# Patient Record
Sex: Male | Born: 1985 | Race: White | Hispanic: Yes | Marital: Single | Smoking: Light tobacco smoker
Health system: Southern US, Community
[De-identification: ages and names within clinical notes are randomized; demographics above are authoritative.]

## PROBLEM LIST (undated history)

## (undated) DIAGNOSIS — N189 Chronic kidney disease, unspecified: Secondary | ICD-10-CM

## (undated) HISTORY — PX: OTHER SURGICAL HISTORY: SHX169

## (undated) HISTORY — PX: AMPUTATION OF REPLICATED TOES: SHX1136

## (undated) HISTORY — DX: Chronic kidney disease, unspecified: N18.9

---

## 2015-11-09 HISTORY — PX: HERNIA REPAIR: SHX51

## 2016-06-05 ENCOUNTER — Ambulatory Visit (INDEPENDENT_AMBULATORY_CARE_PROVIDER_SITE_OTHER): Payer: Managed Care, Other (non HMO) | Admitting: Emergency Medicine

## 2016-06-05 VITALS — BP 119/78 | HR 87 | Temp 97.6°F | Ht 74.0 in | Wt 167.0 lb

## 2016-06-05 DIAGNOSIS — Z202 Contact with and (suspected) exposure to infections with a predominantly sexual mode of transmission: Secondary | ICD-10-CM | POA: Diagnosis not present

## 2016-06-05 NOTE — Patient Instructions (Addendum)
IF you received an x-ray today, you will receive an invoice from [facility]. Please contact [facility] at [phone] with questions or concerns regarding your invoice.   IF you received labwork today, you will receive an invoice from [facility]. Please contact [facility] at [phone] with questions or concerns regarding your invoice.   Our billing staff will not be able to assist you with questions regarding bills from these companies.  You will be contacted with the lab results as soon as they are available. The fastest way to get your results is to activate your My Chart account. Instructions are located on the last page of this paperwork. If you have not heard from us regarding the results in 2 weeks, please contact this office.      Sexually Transmitted Disease A sexually transmitted disease (STD) is a disease or infection often passed to another person during sex. However, STDs can be passed through nonsexual ways. An STD can be passed through:  Spit (saliva).  Semen.  Blood.  Mucus from the vagina.  Pee (urine). How can I lessen my chances of getting an STD?  Use:  Latex condoms.  Water-soluble lubricants with condoms. Do not use petroleum jelly or oils.  Dental dams. These are small pieces of latex that are used as a barrier during oral sex.  Avoid having more than one sex partner.  Do not have sex with someone who has other sex partners.  Do not have sex with anyone you do not know or who is at high risk for an STD.  Avoid risky sex that can break your skin.  Do not have sex if you have open sores on your mouth or skin.  Avoid drinking too much alcohol or taking illegal drugs. Alcohol and drugs can affect your good judgment.  Avoid oral and anal sex acts.  Get shots (vaccines) for HPV and hepatitis.  If you are at risk of being infected with HIV, it is advised that you take a certain medicine daily to prevent HIV infection. This is  called pre-exposure prophylaxis (PrEP). You may be at risk if:  You are a man who has sex with other men (MSM).  You are attracted to the opposite sex (heterosexual) and are having sex with more than one partner.  You take drugs with a needle.  You have sex with someone who has HIV.  Talk with your doctor about if you are at high risk of being infected with HIV. If you begin to take PrEP, get tested for HIV first. Get tested every 3 months for as long as you are taking PrEP.  Get tested for STDs every year if you are sexually active. If you are treated for an STD, get tested again 3 months after you are treated. What should I do if I think I have an STD?  See your doctor.  Tell your sex partner(s) that you have an STD. They should be tested and treated.  Do not have sex until your doctor says it is okay. When should I get help? Get help right away if:  You have bad belly (abdominal) pain.  You are a man and have puffiness (swelling) or pain in your testicles.  You are a woman and have puffiness in your vagina. This information is not intended to replace advice given to you by your health care provider. Make sure you discuss any questions you have with your health care provider. Document Released: 05/04/2004 Document Revised: 09/02/2015  Reviewed: 09/20/2012 Elsevier Interactive Patient Education  2017 Elsevier Inc.  

## 2016-06-05 NOTE — Progress Notes (Signed)
Kenneth Berg 31 y.o.   Chief Complaint  Patient presents with  . STD Test    HISTORY OF PRESENT ILLNESS: This is a 31 y.o. male with STD concern but no symptoms; had unprotected sexual intercourse last Saturday.  HPI   Prior to Admission medications   Not on File    Allergies  Allergen Reactions  . Prednisone     There are no active problems to display for this patient.   Past Medical History:  Diagnosis Date  . Chronic kidney disease     Past Surgical History:  Procedure Laterality Date  . HERNIA REPAIR  11/2015    Social History   Social History  . Marital status: Single    Spouse name: N/A  . Number of children: N/A  . Years of education: N/A   Occupational History  . Not on file.   Social History Main Topics  . Smoking status: Light Tobacco Smoker    Packs/day: 0.25  . Smokeless tobacco: Never Used  . Alcohol use 6.0 oz/week    10 Shots of liquor per week  . Drug use: No  . Sexual activity: Not on file   Other Topics Concern  . Not on file   Social History Narrative  . No narrative on file    Family History  Problem Relation Age of Onset  . Hypertension Maternal Grandmother   . Diabetes Paternal Grandmother      Review of Systems  Constitutional: Negative.  Negative for chills and fever.  HENT: Negative for sore throat.   Eyes: Negative for discharge and redness.  Respiratory: Negative.  Negative for cough and shortness of breath.   Cardiovascular: Negative for chest pain.  Gastrointestinal: Negative.  Negative for abdominal pain, nausea and vomiting.  Genitourinary: Negative.  Negative for dysuria, flank pain, frequency, hematuria and urgency.  Musculoskeletal: Negative.  Negative for joint pain and myalgias.  Skin: Negative.  Negative for rash.  Neurological: Negative.  Negative for dizziness and headaches.   Vitals:   06/05/16 1730  BP: 119/78  Pulse: 87  Temp: 97.6 F (36.4 C)     Physical Exam  Constitutional:  He is oriented to person, place, and time. He appears well-developed and well-nourished.  HENT:  Head: Normocephalic and atraumatic.  Nose: Nose normal.  Mouth/Throat: Oropharynx is clear and moist.  Eyes: Conjunctivae and EOM are normal. Pupils are equal, round, and reactive to light.  Neck: Normal range of motion. Neck supple.  Cardiovascular: Normal rate and regular rhythm.   Pulmonary/Chest: Effort normal and breath sounds normal.  Abdominal: Soft. Bowel sounds are normal.  Musculoskeletal: Normal range of motion.  Neurological: He is alert and oriented to person, place, and time.  Skin: Skin is warm and dry. Capillary refill takes less than 2 seconds.  Psychiatric: He has a normal mood and affect. His behavior is normal.  Vitals reviewed.    ASSESSMENT & PLAN: Tadhg was seen today for std test.  Diagnoses and all orders for this visit:  Possible exposure to STD -     Chlamydia trachomatis, DNA, amp probe -     Gonococcus DNA, PCR -     Urine culture -     HIV antibody -     Testosterone    Patient Instructions       IF you received an x-ray today, you will receive an invoice from [facility]. Please contact [facility] at [phone] with questions or concerns regarding your invoice.   IF  you received labwork today, you will receive an invoice from [facility]. Please contact [facility] at [phone] with questions or concerns regarding your invoice.   Our billing staff will not be able to assist you with questions regarding bills from these companies.  You will be contacted with the lab results as soon as they are available. The fastest way to get your results is to activate your My Chart account. Instructions are located on the last page of this paperwork. If you have not heard from us regarding the results in 2 weeks, please contact this office.      Sexually Transmitted Disease A sexually transmitted disease (STD) is a disease or infection  often passed to another person during sex. However, STDs can be passed through nonsexual ways. An STD can be passed through:  Spit (saliva).  Semen.  Blood.  Mucus from the vagina.  Pee (urine). How can I lessen my chances of getting an STD?  Use:  Latex condoms.  Water-soluble lubricants with condoms. Do not use petroleum jelly or oils.  Dental dams. These are small pieces of latex that are used as a barrier during oral sex.  Avoid having more than one sex partner.  Do not have sex with someone who has other sex partners.  Do not have sex with anyone you do not know or who is at high risk for an STD.  Avoid risky sex that can break your skin.  Do not have sex if you have open sores on your mouth or skin.  Avoid drinking too much alcohol or taking illegal drugs. Alcohol and drugs can affect your good judgment.  Avoid oral and anal sex acts.  Get shots (vaccines) for HPV and hepatitis.  If you are at risk of being infected with HIV, it is advised that you take a certain medicine daily to prevent HIV infection. This is called pre-exposure prophylaxis (PrEP). You may be at risk if:  You are a man who has sex with other men (MSM).  You are attracted to the opposite sex (heterosexual) and are having sex with more than one partner.  You take drugs with a needle.  You have sex with someone who has HIV.  Talk with your doctor about if you are at high risk of being infected with HIV. If you begin to take PrEP, get tested for HIV first. Get tested every 3 months for as long as you are taking PrEP.  Get tested for STDs every year if you are sexually active. If you are treated for an STD, get tested again 3 months after you are treated. What should I do if I think I have an STD?  See your doctor.  Tell your sex partner(s) that you have an STD. They should be tested and treated.  Do not have sex until your doctor says it is okay. When should I get help? Get help right  away if:  You have bad belly (abdominal) pain.  You are a man and have puffiness (swelling) or pain in your testicles.  You are a woman and have puffiness in your vagina. This information is not intended to replace advice given to you by your health care provider. Make sure you discuss any questions you have with your health care provider. Document Released: 05/04/2004 Document Revised: 09/02/2015 Document Reviewed: 09/20/2012 Elsevier Interactive Patient Education  2017 Elsevier Inc.    Edwina BarthMiguel Jovi Alvizo, MD [facility]

## 2016-06-06 LAB — TESTOSTERONE: Testosterone: 310 ng/dL (ref 264–916)

## 2016-06-06 LAB — HIV ANTIBODY (ROUTINE TESTING W REFLEX): HIV Screen 4th Generation wRfx: NONREACTIVE

## 2016-06-07 LAB — CHLAMYDIA TRACHOMATIS, DNA, AMP PROBE: CHLAMYDIA, DNA PROBE: NEGATIVE

## 2016-06-07 LAB — URINE CULTURE: ORGANISM ID, BACTERIA: NO GROWTH

## 2016-06-09 ENCOUNTER — Telehealth: Payer: Self-pay | Admitting: Emergency Medicine

## 2016-06-09 LAB — GONOCOCCUS DNA, PCR: NEISSERIA GONORRHOEAE BY PCR: NEGATIVE

## 2016-06-09 NOTE — Telephone Encounter (Signed)
Pt would like a callback concerning 2 unreleased lab results. He has already received 3 results but would like information on the other 2. Please advise. Best callback number [phone].

## 2016-06-09 NOTE — Telephone Encounter (Signed)
See note from patient

## 2016-06-10 NOTE — Telephone Encounter (Signed)
Pt called concerning the same matter pt would like his last 2 lab test results read to him.

## 2016-08-08 ENCOUNTER — Encounter (HOSPITAL_COMMUNITY): Payer: Self-pay | Admitting: Emergency Medicine

## 2016-08-08 ENCOUNTER — Emergency Department (HOSPITAL_COMMUNITY)
Admission: EM | Admit: 2016-08-08 | Discharge: 2016-08-08 | Disposition: A | Discharge status: Home / Self Care | Payer: Worker's Compensation | Attending: Physician Assistant | Admitting: Physician Assistant

## 2016-08-08 ENCOUNTER — Emergency Department (HOSPITAL_COMMUNITY): Payer: Worker's Compensation

## 2016-08-08 DIAGNOSIS — Y939 Activity, unspecified: Secondary | ICD-10-CM | POA: Insufficient documentation

## 2016-08-08 DIAGNOSIS — Y990 Civilian activity done for income or pay: Secondary | ICD-10-CM | POA: Diagnosis not present

## 2016-08-08 DIAGNOSIS — S0033XA Contusion of nose, initial encounter: Secondary | ICD-10-CM | POA: Insufficient documentation

## 2016-08-08 DIAGNOSIS — F17200 Nicotine dependence, unspecified, uncomplicated: Secondary | ICD-10-CM | POA: Diagnosis not present

## 2016-08-08 DIAGNOSIS — N189 Chronic kidney disease, unspecified: Secondary | ICD-10-CM | POA: Diagnosis not present

## 2016-08-08 DIAGNOSIS — S0083XA Contusion of other part of head, initial encounter: Secondary | ICD-10-CM | POA: Insufficient documentation

## 2016-08-08 DIAGNOSIS — S0993XA Unspecified injury of face, initial encounter: Secondary | ICD-10-CM | POA: Diagnosis present

## 2016-08-08 DIAGNOSIS — W228XXA Striking against or struck by other objects, initial encounter: Secondary | ICD-10-CM | POA: Diagnosis not present

## 2016-08-08 DIAGNOSIS — Y929 Unspecified place or not applicable: Secondary | ICD-10-CM | POA: Diagnosis not present
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
## 2016-08-08 NOTE — ED Notes (Signed)
Patient transported to CT 

## 2016-08-08 NOTE — ED Notes (Signed)
Pt returned from CT °

## 2020-07-27 ENCOUNTER — Encounter: Payer: Self-pay | Admitting: Physician Assistant

## 2020-08-05 DIAGNOSIS — K589 Irritable bowel syndrome without diarrhea: Secondary | ICD-10-CM

## 2020-08-05 DIAGNOSIS — K824 Cholesterolosis of gallbladder: Secondary | ICD-10-CM | POA: Insufficient documentation

## 2020-08-05 DIAGNOSIS — R1012 Left upper quadrant pain: Secondary | ICD-10-CM

## 2020-08-05 DIAGNOSIS — H6980 Other specified disorders of Eustachian tube, unspecified ear: Secondary | ICD-10-CM | POA: Insufficient documentation

## 2020-08-05 DIAGNOSIS — H6990 Unspecified Eustachian tube disorder, unspecified ear: Secondary | ICD-10-CM | POA: Insufficient documentation

## 2020-08-05 DIAGNOSIS — R195 Other fecal abnormalities: Secondary | ICD-10-CM | POA: Insufficient documentation

## 2020-08-05 DIAGNOSIS — R140 Abdominal distension (gaseous): Secondary | ICD-10-CM | POA: Insufficient documentation

## 2020-08-05 DIAGNOSIS — E7800 Pure hypercholesterolemia, unspecified: Secondary | ICD-10-CM | POA: Insufficient documentation

## 2020-08-05 DIAGNOSIS — K219 Gastro-esophageal reflux disease without esophagitis: Secondary | ICD-10-CM | POA: Insufficient documentation

## 2020-08-05 DIAGNOSIS — R1013 Epigastric pain: Secondary | ICD-10-CM

## 2020-08-11 ENCOUNTER — Ambulatory Visit: Payer: Managed Care, Other (non HMO) | Admitting: Physician Assistant

## 2021-04-09 ENCOUNTER — Encounter (HOSPITAL_COMMUNITY): Payer: Self-pay

## 2021-04-09 ENCOUNTER — Emergency Department (HOSPITAL_COMMUNITY): Payer: Managed Care, Other (non HMO)

## 2021-04-09 ENCOUNTER — Emergency Department (HOSPITAL_COMMUNITY)
Admission: EM | Admit: 2021-04-09 | Discharge: 2021-04-09 | Disposition: A | Discharge status: Home / Self Care | Payer: Managed Care, Other (non HMO) | Attending: Emergency Medicine | Admitting: Emergency Medicine

## 2021-04-09 DIAGNOSIS — S8991XA Unspecified injury of right lower leg, initial encounter: Secondary | ICD-10-CM | POA: Diagnosis present

## 2021-04-09 DIAGNOSIS — S81801A Unspecified open wound, right lower leg, initial encounter: Secondary | ICD-10-CM | POA: Insufficient documentation

## 2021-04-09 DIAGNOSIS — N189 Chronic kidney disease, unspecified: Secondary | ICD-10-CM | POA: Diagnosis not present

## 2021-04-09 DIAGNOSIS — Y249XXA Unspecified firearm discharge, undetermined intent, initial encounter: Secondary | ICD-10-CM

## 2021-04-09 DIAGNOSIS — F17210 Nicotine dependence, cigarettes, uncomplicated: Secondary | ICD-10-CM | POA: Insufficient documentation

## 2021-04-09 DIAGNOSIS — W3400XA Accidental discharge from unspecified firearms or gun, initial encounter: Secondary | ICD-10-CM | POA: Diagnosis not present

## 2021-04-09 DIAGNOSIS — Z23 Encounter for immunization: Secondary | ICD-10-CM | POA: Insufficient documentation

## 2021-04-09 MED ORDER — TETANUS-DIPHTH-ACELL PERTUSSIS 5-2.5-18.5 LF-MCG/0.5 IM SUSY
0.5000 mL | PREFILLED_SYRINGE | Freq: Once | INTRAMUSCULAR | Status: AC
  Administered 2021-04-09: 0.5 mL via INTRAMUSCULAR
  Filled 2021-04-09: qty 0.5
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
## 2021-04-09 NOTE — Discharge Instructions (Signed)
You were evaluated in the Emergency Department and after careful evaluation, we did not find any emergent condition requiring admission or further testing in the hospital.  Your exam/testing today is overall reassuring.  X-ray was overall reassuring.  We discussed the very subtle irregularity on your x-ray and we feel that it is very unlikely that you have a broken bone.  The wound should heal well with time, can change the dressing daily and keep an eye out for any redness or discharge or increased pain.  Recommend Tylenol or Motrin for discomfort.  Use the crutches as needed.  Please return to the Emergency Department if you experience any worsening of your condition.   Thank you for allowing [place] to be a part of your care.

## 2021-07-21 ENCOUNTER — Telehealth: Payer: Self-pay | Admitting: Family Medicine

## 2021-07-21 NOTE — Telephone Encounter (Signed)
Called Pt to reschedule new patient appt w/ Dr. Carmelia Roller due to provider schedule. LVM ?

## 2021-08-09 ENCOUNTER — Ambulatory Visit (INDEPENDENT_AMBULATORY_CARE_PROVIDER_SITE_OTHER): Payer: 59 | Admitting: Family Medicine

## 2021-08-09 ENCOUNTER — Encounter: Payer: Self-pay | Admitting: Family Medicine

## 2021-08-09 VITALS — BP 120/76 | HR 86 | Temp 98.1°F | Ht 74.0 in | Wt 197.0 lb

## 2021-08-09 DIAGNOSIS — G8929 Other chronic pain: Secondary | ICD-10-CM | POA: Diagnosis not present

## 2021-08-09 DIAGNOSIS — Z0000 Encounter for general adult medical examination without abnormal findings: Secondary | ICD-10-CM | POA: Diagnosis not present

## 2021-08-09 DIAGNOSIS — M25562 Pain in left knee: Secondary | ICD-10-CM | POA: Diagnosis not present

## 2021-08-09 LAB — COMPREHENSIVE METABOLIC PANEL
ALT: 27 U/L (ref 0–53)
AST: 19 U/L (ref 0–37)
Albumin: 4.8 g/dL (ref 3.5–5.2)
Alkaline Phosphatase: 57 U/L (ref 39–117)
BUN: 14 mg/dL (ref 6–23)
CO2: 32 mEq/L (ref 19–32)
Calcium: 9.2 mg/dL (ref 8.4–10.5)
Chloride: 100 mEq/L (ref 96–112)
Creatinine, Ser: 1.24 mg/dL (ref 0.40–1.50)
GFR: 75.93 mL/min (ref >60.00)
Glucose, Bld: 78 mg/dL (ref 70–99)
Potassium: 4.2 mEq/L (ref 3.5–5.1)
Sodium: 138 mEq/L (ref 135–145)
Total Bilirubin: 0.9 mg/dL (ref 0.2–1.2)
Total Protein: 7.2 g/dL (ref 6.0–8.3)

## 2021-08-09 LAB — CBC
HCT: 47 % (ref 39.0–52.0)
Hemoglobin: 15.7 g/dL (ref 13.0–17.0)
MCHC: 33.5 g/dL (ref 30.0–36.0)
MCV: 85.4 fl (ref 78.0–100.0)
Platelets: 260 10*3/uL (ref 150.0–400.0)
RBC: 5.5 Mil/uL (ref 4.22–5.81)
RDW: 13.7 % (ref 11.5–15.5)
WBC: 7.2 10*3/uL (ref 4.0–10.5)

## 2021-08-09 LAB — LIPID PANEL
Cholesterol: 175 mg/dL (ref 0–200)
HDL: 37 mg/dL — ABNORMAL LOW (ref >39.00)
LDL Cholesterol: 109 mg/dL — ABNORMAL HIGH (ref 0–99)
NonHDL: 137.96
Total CHOL/HDL Ratio: 5
Triglycerides: 147 mg/dL (ref 0.0–149.0)
VLDL: 29.4 mg/dL (ref 0.0–40.0)
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
## 2021-08-09 NOTE — Patient Instructions (Addendum)
Keep the diet clean and stay active. ? ?Give [place] 2-3 business days to get the results of your labs back.  ? ?Do monthly self testicular checks in the shower. You are feeling for lumps/bumps that don't belong. If you feel anything like this, let me know! ? ?Please get me a copy of your advanced directive form at your convenience.  ? ?Send me a message in 1 month if no better with the calf.  ? ?Let us know if you need anything. ? ?Stretching and range of motion exercises ?These exercises warm up your muscles and joints and improve the movement and flexibility of your lower leg. These exercises also help to relieve pain and stiffness. ? ?Exercise A: Gastrocnemius stretch ?Sit with your left / right leg extended. ?Loop a belt or towel around the ball of your left / right foot. The ball of your foot is on the walking surface, right under your toes. ?Hold both ends of the belt or towel. ?Keep your left / right ankle and foot relaxed and keep your knee straight while you use the belt or towel to pull your foot and ankle toward you. Stop at the first point of resistance. ?Hold this position for 30 seconds. ?Repeat 2 times. Complete this exercise 3 times per week. ? ?Exercise B: Ankle alphabet ?Sit with your left / right leg supported at the lower leg. ?Do not rest your foot on anything. ?Make sure your foot has room to move freely. ?Think of your left / right foot as a paintbrush, and move your foot to trace each letter of the alphabet in the air. Keep your hip and knee still while you trace. ?Trace every letter from A to Z. ?Repeat 2 times. Complete this exercise 3 times per week. ? ?Strengthening exercises ?These exercises build strength and endurance in your lower leg. Endurance is the ability to use your muscles for a long time, even after they get tired. ? ?Exercise C: Plantar flexors with band ?Sit with your left / right leg extended. ?Loop a rubber exercise band or tube around the ball of your left / right foot. The  ball of your foot is on the walking surface, right under your toes. ?While holding both ends of the band or tube, slowly point your toes downward, pushing them away from you. ?Hold this position for 3 seconds. ?Slowly return your foot to the starting position and repeat for a total of 10 repetitions. ?Repeat 2 times. Complete this exercise 3 times per week. ? ?Exercise D: Plantar flexors, standing ?Stand with your feet shoulder-width apart. ?Place your hands on a wall or table to steady yourself as needed, but try not to use it very much for support. ?Rise up on your toes. ?If this exercise is too easy, try these options: ?Shift your weight toward your left / right leg until you feel challenged. ?If told by your health care provider, stand on your left / right foot only. ?Hold this position for 3 seconds. ?Repeat for a total of 10 repetitions. ?Repeat 2 times. Complete this exercise 3 times per week. ? ?Exercise E: Plantar flexors, eccentric ?Stand on the balls of your feet on the edge of a step. The ball of your foot is on the walking surface, right under your toes. ?Place your hands on a wall or railing for balance as needed, but try not to lean on it for support. ?Rise up on your toes, using both legs to help. ?Slowly shift all of your  weight to your left / right foot and lift your other foot off the step. ?Slowly lower your left / right heel so it drops below the level of the step. You will feel a slight stretch in your left / right calf. ?Put your other foot back onto the step. ?Repeat 2 times. Complete this exercise 3 times per week. ?This information is not intended to replace advice given to you by your health care provider. Make sure you discuss any questions you have with your health care provider. ? ?

## 2021-10-24 ENCOUNTER — Ambulatory Visit (HOSPITAL_BASED_OUTPATIENT_CLINIC_OR_DEPARTMENT_OTHER)
Admission: RE | Admit: 2021-10-24 | Discharge: 2021-10-24 | Disposition: A | Discharge status: Home / Self Care | Payer: 59 | Source: Ambulatory Visit | Attending: Family Medicine | Admitting: Family Medicine

## 2021-10-24 ENCOUNTER — Ambulatory Visit: Payer: 59 | Admitting: Family Medicine

## 2021-10-24 ENCOUNTER — Encounter: Payer: Self-pay | Admitting: Family Medicine

## 2021-10-24 VITALS — BP 118/75 | HR 104 | Ht 74.0 in | Wt 198.4 lb

## 2021-10-24 DIAGNOSIS — M79644 Pain in right finger(s): Secondary | ICD-10-CM | POA: Diagnosis present

## 2021-10-24 NOTE — Patient Instructions (Addendum)
-   Xray today  - ice, heat, rest, compression, consider a thumb stabilizer brace over-the-counter to pain-producing activities at work - home exercises provided - since you cannot tolerate NSAIDs, try tylenol, topical pain relief creams over-the-counter - referral to sports medicine in case additional imaging is necessary

## 2021-10-24 NOTE — Progress Notes (Signed)
   Acute Office Visit  Subjective:     Patient ID: Kenneth Berg, male    DOB: 05-Nov-1985, 36 y.o.   MRN: [id number]  CC: right thumb pain   HPI Patient is in today for right thumb pain.  Patient reports he has had over a week of moderate pain to base of right thumb, medially. 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 States pain is with stretching, pressing, gripping. He has not noticed and swelling, bruising, numbness, tingling. He cannot tolerate antiinflammatories due to kidney concerns, but he has been getting some relief with ice.       ROS All review of systems negative except what is listed in the HPI      Objective:    BP 118/75   Pulse (!) 104   Ht 6\' 2"  (1.88 m)   Wt 198 lb 6.4 oz (90 kg)   BMI 25.47 kg/m    Physical Exam Vitals reviewed.  Constitutional:      Appearance: Normal appearance.  Musculoskeletal:        General: Tenderness present. No swelling. Normal range of motion.       Hands:  Skin:    General: Skin is warm and dry.     Findings: No bruising.  Neurological:     General: No focal deficit present.     Mental Status: He is alert and oriented to person, place, and time. Mental status is at baseline.  Psychiatric:        Mood and Affect: Mood normal.        Behavior: Behavior normal.        Thought Content: Thought content normal.        Judgment: Judgment normal.       No results found for any visits on 10/24/21.      Assessment & Plan:   1. Pain of right thumb - Xray today  - ice, heat, rest, compression, consider a thumb stabilizer brace over-the-counter during pain-producing activities at work - try to avoid triggering activities  - home exercises provided - since you cannot tolerate NSAIDs, try tylenol, topical pain relief creams over-the-counter - referral to sports medicine in case additional imaging is necessary    - DG Finger  Thumb Right; Future - Ambulatory referral to Sports Medicine   Return if symptoms worsen or fail to improve.  10/26/21, NP

## 2022-05-05 ENCOUNTER — Encounter: Payer: Self-pay | Admitting: Family Medicine

## 2022-05-05 ENCOUNTER — Ambulatory Visit: Payer: 59 | Admitting: Family Medicine

## 2022-05-05 VITALS — BP 111/80 | HR 104 | Temp 98.0°F | Ht 74.0 in | Wt 203.2 lb

## 2022-05-05 DIAGNOSIS — M545 Low back pain, unspecified: Secondary | ICD-10-CM | POA: Diagnosis not present

## 2022-05-05 MED ORDER — TIZANIDINE HCL 4 MG PO TABS: 4.0000 mg | ORAL_TABLET | Freq: Four times a day (QID) | ORAL | 0 refills | Status: DC | PRN

## 2022-05-05 NOTE — Progress Notes (Signed)
Musculoskeletal Exam  Patient: Kenneth Berg DOB: 01-01-1986  DOS: 05/05/2022  SUBJECTIVE:  Chief Complaint:   Chief Complaint  Patient presents with   Back Pain    Kenneth Berg is a 37 y.o.  male for evaluation and treatment of back pain.   Onset:  5 days ago. Bent over to rack 70 lb dumbbells.  Location: lower Character:  Stabbing Progression of issue:  has waxed and waned Associated symptoms: pain w certain movements No bruising, redness, swelling.  Denies bowel/bladder incontinence or weakness Treatment: to date has been rest and massage.   Neurovascular symptoms: no  Past Medical History:  Diagnosis Date   Chronic kidney disease     Objective:  VITAL SIGNS: BP 111/80 (BP Location: Left Arm, Patient Position: Sitting, Cuff Size: Normal)   Pulse (!) 104   Temp 98 F (36.7 C) (Oral)   Ht 6\' 2"  (1.88 m)   Wt 203 lb 4 oz (92.2 kg)   SpO2 96%   BMI 26.10 kg/m  Constitutional: Well formed, well developed. No acute distress. HENT: Normocephalic, atraumatic.  Thorax & Lungs:  No accessory muscle use Musculoskeletal: low back.   Tenderness to palpation: mild ttp over lumbar paras msc on R Deformity: no Ecchymosis: no Straight leg test: negative for Extremely poor hamstring flexibility b/l. Pain w resisted R hip flexion Neurologic: Normal sensory function. No focal deficits noted. DTR's equal and symmetric in LE's. No clonus. Psychiatric: Normal mood. Age appropriate judgment and insight. Alert & oriented x 3.    Assessment:  Acute bilateral low back pain without sciatica - Plan: tiZANidine (ZANAFLEX) 4 MG tablet  Plan: Stretches/exercises, heat, ice, Tylenol, NSAIDs. Flexibility is not very good. With improvement of this, would believe he would have fewer issues moving forward. Zanaflex prn.  F/u prn. The patient voiced understanding and agreement to the plan.   [place], DO 05/05/22  2:23 PM

## 2022-05-05 NOTE — Patient Instructions (Signed)
Heat (pad or rice pillow in microwave) over affected area, 10-15 minutes twice daily.   Ice/cold pack over area for 10-15 min twice daily.  Start stretching routinely.  OK to take Tylenol 1000 mg (2 extra strength tabs) or 975 mg (3 regular strength tabs) every 6 hours as needed.  Ibuprofen 400-600 mg (2-3 over the counter strength tabs) every 6 hours as needed for pain.  Let us know if you need anything.  EXERCISES  RANGE OF MOTION (ROM) AND STRETCHING EXERCISES - Low Back Pain Most people with lower back pain will find that their symptoms get worse with excessive bending forward (flexion) or arching at the lower back (extension). The exercises that will help resolve your symptoms will focus on the opposite motion.  If you have pain, numbness or tingling which travels down into your buttocks, leg or foot, the goal of the therapy is for these symptoms to move closer to your back and eventually resolve. Sometimes, these leg symptoms will get better, but your lower back pain may worsen. This is often an indication of progress in your rehabilitation. Be very alert to any changes in your symptoms and the activities in which you participated in the 24 hours prior to the change. Sharing this information with your caregiver will allow him or her to most efficiently treat your condition. These exercises may help you when beginning to rehabilitate your injury. Your symptoms may resolve with or without further involvement from your physician, physical therapist or athletic trainer. While completing these exercises, remember:  Restoring tissue flexibility helps normal motion to return to the joints. This allows healthier, less painful movement and activity. An effective stretch should be held for at least 30 seconds. A stretch should never be painful. You should only feel a gentle lengthening or release in the stretched tissue. FLEXION RANGE OF MOTION AND STRETCHING EXERCISES:  STRETCH - Flexion, Single  Knee to Chest  Lie on a firm bed or floor with both legs extended in front of you. Keeping one leg in contact with the floor, bring your opposite knee to your chest. Hold your leg in place by either grabbing behind your thigh or at your knee. Pull until you feel a gentle stretch in your low back. Hold 30 seconds. Slowly release your grasp and repeat the exercise with the opposite side. Repeat 2 times. Complete this exercise 3 times per week.   STRETCH - Flexion, Double Knee to Chest Lie on a firm bed or floor with both legs extended in front of you. Keeping one leg in contact with the floor, bring your opposite knee to your chest. Tense your stomach muscles to support your back and then lift your other knee to your chest. Hold your legs in place by either grabbing behind your thighs or at your knees. Pull both knees toward your chest until you feel a gentle stretch in your low back. Hold 30 seconds. Tense your stomach muscles and slowly return one leg at a time to the floor. Repeat 2 times. Complete this exercise 3 times per week.   STRETCH - Low Trunk Rotation Lie on a firm bed or floor. Keeping your legs in front of you, bend your knees so they are both pointed toward the ceiling and your feet are flat on the floor. Extend your arms out to the side. This will stabilize your upper body by keeping your shoulders in contact with the floor. Gently and slowly drop both knees together to one side until you  feel a gentle stretch in your low back. Hold for 30 seconds. Tense your stomach muscles to support your lower back as you bring your knees back to the starting position. Repeat the exercise to the other side. Repeat 2 times. Complete this exercise at least 3 times per week.   EXTENSION RANGE OF MOTION AND FLEXIBILITY EXERCISES:  STRETCH - Extension, Prone on Elbows  Lie on your stomach on the floor, a bed will be too soft. Place your palms about shoulder width apart and at the height of your  head. Place your elbows under your shoulders. If this is too painful, stack pillows under your chest. Allow your body to relax so that your hips drop lower and make contact more completely with the floor. Hold this position for 30 seconds. Slowly return to lying flat on the floor. Repeat 2 times. Complete this exercise 3 times per week.   RANGE OF MOTION - Extension, Prone Press Ups Lie on your stomach on the floor, a bed will be too soft. Place your palms about shoulder width apart and at the height of your head. Keeping your back as relaxed as possible, slowly straighten your elbows while keeping your hips on the floor. You may adjust the placement of your hands to maximize your comfort. As you gain motion, your hands will come more underneath your shoulders. Hold this position 30 seconds. Slowly return to lying flat on the floor. Repeat 2 times. Complete this exercise 3 times per week.   RANGE OF MOTION- Quadruped, Neutral Spine  Assume a hands and knees position on a firm surface. Keep your hands under your shoulders and your knees under your hips. You may place padding under your knees for comfort. Drop your head and point your tailbone toward the ground below you. This will round out your lower back like an angry cat. Hold this position for 30 seconds. Slowly lift your head and release your tail bone so that your back sags into a large arch, like an old horse. Hold this position for 30 seconds. Repeat this until you feel limber in your low back. Now, find your "sweet spot." This will be the most comfortable position somewhere between the two previous positions. This is your neutral spine. Once you have found this position, tense your stomach muscles to support your low back. Hold this position for 30 seconds. Repeat 2 times. Complete this exercise 3 times per week.   STRENGTHENING EXERCISES - Low Back Sprain These exercises may help you when beginning to rehabilitate your injury. These  exercises should be done near your "sweet spot." This is the neutral, low-back arch, somewhere between fully rounded and fully arched, that is your least painful position. When performed in this safe range of motion, these exercises can be used for people who have either a flexion or extension based injury. These exercises may resolve your symptoms with or without further involvement from your physician, physical therapist or athletic trainer. While completing these exercises, remember:  Muscles can gain both the endurance and the strength needed for everyday activities through controlled exercises. Complete these exercises as instructed by your physician, physical therapist or athletic trainer. Increase the resistance and repetitions only as guided. You may experience muscle soreness or fatigue, but the pain or discomfort you are trying to eliminate should never worsen during these exercises. If this pain does worsen, stop and make certain you are following the directions exactly. If the pain is still present after adjustments, discontinue the exercise  until you can discuss the trouble with your caregiver.  STRENGTHENING - Deep Abdominals, Pelvic Tilt  Lie on a firm bed or floor. Keeping your legs in front of you, bend your knees so they are both pointed toward the ceiling and your feet are flat on the floor. Tense your lower abdominal muscles to press your low back into the floor. This motion will rotate your pelvis so that your tail bone is scooping upwards rather than pointing at your feet or into the floor. With a gentle tension and even breathing, hold this position for 3 seconds. Repeat 2 times. Complete this exercise 3 times per week.   STRENGTHENING - Abdominals, Crunches  Lie on a firm bed or floor. Keeping your legs in front of you, bend your knees so they are both pointed toward the ceiling and your feet are flat on the floor. Cross your arms over your chest. Slightly tip your chin down  without bending your neck. Tense your abdominals and slowly lift your trunk high enough to just clear your shoulder blades. Lifting higher can put excessive stress on the lower back and does not further strengthen your abdominal muscles. Control your return to the starting position. Repeat 2 times. Complete this exercise 3 times per week.   STRENGTHENING - Quadruped, Opposite UE/LE Lift  Assume a hands and knees position on a firm surface. Keep your hands under your shoulders and your knees under your hips. You may place padding under your knees for comfort. Find your neutral spine and gently tense your abdominal muscles so that you can maintain this position. Your shoulders and hips should form a rectangle that is parallel with the floor and is not twisted. Keeping your trunk steady, lift your right hand no higher than your shoulder and then your left leg no higher than your hip. Make sure you are not holding your breath. Hold this position for 30 seconds. Continuing to keep your abdominal muscles tense and your back steady, slowly return to your starting position. Repeat with the opposite arm and leg. Repeat 2 times. Complete this exercise 3 times per week.   STRENGTHENING - Abdominals and Quadriceps, Straight Leg Raise  Lie on a firm bed or floor with both legs extended in front of you. Keeping one leg in contact with the floor, bend the other knee so that your foot can rest flat on the floor. Find your neutral spine, and tense your abdominal muscles to maintain your spinal position throughout the exercise. Slowly lift your straight leg off the floor about 6 inches for a count of 3, making sure to not hold your breath. Still keeping your neutral spine, slowly lower your leg all the way to the floor. Repeat this exercise with each leg 2 times. Complete this exercise 3 times per week.  POSTURE AND BODY MECHANICS CONSIDERATIONS - Low Back Sprain Keeping correct posture when sitting, standing or  completing your activities will reduce the stress put on different body tissues, allowing injured tissues a chance to heal and limiting painful experiences. The following are general guidelines for improved posture.  While reading these guidelines, remember: The exercises prescribed by your provider will help you have the flexibility and strength to maintain correct postures. The correct posture provides the best environment for your joints to work. All of your joints have less wear and tear when properly supported by a spine with good posture. This means you will experience a healthier, less painful body. Correct posture must be practiced with  all of your activities, especially prolonged sitting and standing. Correct posture is as important when doing repetitive low-stress activities (typing) as it is when doing a single heavy-load activity (lifting).  RESTING POSITIONS Consider which positions are most painful for you when choosing a resting position. If you have pain with flexion-based activities (sitting, bending, stooping, squatting), choose a position that allows you to rest in a less flexed posture. You would want to avoid curling into a fetal position on your side. If your pain worsens with extension-based activities (prolonged standing, working overhead), avoid resting in an extended position such as sleeping on your stomach. Most people will find more comfort when they rest with their spine in a more neutral position, neither too rounded nor too arched. Lying on a non-sagging bed on your side with a pillow between your knees, or on your back with a pillow under your knees will often provide some relief. Keep in mind, being in any one position for a prolonged period of time, no matter how correct your posture, can still lead to stiffness.  PROPER SITTING POSTURE In order to minimize stress and discomfort on your spine, you must sit with correct posture. Sitting with good posture should be  effortless for a healthy body. Returning to good posture is a gradual process. Many people can work toward this most comfortably by using various supports until they have the flexibility and strength to maintain this posture on their own. When sitting with proper posture, your ears will fall over your shoulders and your shoulders will fall over your hips. You should use the back of the chair to support your upper back. Your lower back will be in a neutral position, just slightly arched. You may place a small pillow or folded towel at the base of your lower back for  support.  When working at a desk, create an environment that supports good, upright posture. Without extra support, muscles tire, which leads to excessive strain on joints and other tissues. Keep these recommendations in mind:  CHAIR: A chair should be able to slide under your desk when your back makes contact with the back of the chair. This allows you to work closely. The chair's height should allow your eyes to be level with the upper part of your monitor and your hands to be slightly lower than your elbows.  BODY POSITION Your feet should make contact with the floor. If this is not possible, use a foot rest. Keep your ears over your shoulders. This will reduce stress on your neck and low back.  INCORRECT SITTING POSTURES  If you are feeling tired and unable to assume a healthy sitting posture, do not slouch or slump. This puts excessive strain on your back tissues, causing more damage and pain. Healthier options include: Using more support, like a lumbar pillow. Switching tasks to something that requires you to be upright or walking. Talking a brief walk. Lying down to rest in a neutral-spine position.  PROLONGED STANDING WHILE SLIGHTLY LEANING FORWARD  When completing a task that requires you to lean forward while standing in one place for a long time, place either foot up on a stationary 2-4 inch high object to help maintain  the best posture. When both feet are on the ground, the lower back tends to lose its slight inward curve. If this curve flattens (or becomes too large), then the back and your other joints will experience too much stress, tire more quickly, and can cause pain.  CORRECT  STANDING POSTURES Proper standing posture should be assumed with all daily activities, even if they only take a few moments, like when brushing your teeth. As in sitting, your ears should fall over your shoulders and your shoulders should fall over your hips. You should keep a slight tension in your abdominal muscles to brace your spine. Your tailbone should point down to the ground, not behind your body, resulting in an over-extended swayback posture.   INCORRECT STANDING POSTURES  Common incorrect standing postures include a forward head, locked knees and/or an excessive swayback. WALKING Walk with an upright posture. Your ears, shoulders and hips should all line-up.  PROLONGED ACTIVITY IN A FLEXED POSITION When completing a task that requires you to bend forward at your waist or lean over a low surface, try to find a way to stabilize 3 out of 4 of your limbs. You can place a hand or elbow on your thigh or rest a knee on the surface you are reaching across. This will provide you more stability, so that your muscles do not tire as quickly. By keeping your knees relaxed, or slightly bent, you will also reduce stress across your lower back. CORRECT LIFTING TECHNIQUES  DO : Assume a wide stance. This will provide you more stability and the opportunity to get as close as possible to the object which you are lifting. Tense your abdominals to brace your spine. Bend at the knees and hips. Keeping your back locked in a neutral-spine position, lift using your leg muscles. Lift with your legs, keeping your back straight. Test the weight of unknown objects before attempting to lift them. Try to keep your elbows locked down at your sides in  order get the best strength from your shoulders when carrying an object.   Always ask for help when lifting heavy or awkward objects. INCORRECT LIFTING TECHNIQUES DO NOT:  Lock your knees when lifting, even if it is a small object. Bend and twist. Pivot at your feet or move your feet when needing to change directions. Assume that you can safely pick up even a paperclip without proper posture.

## 2022-05-10 ENCOUNTER — Other Ambulatory Visit: Payer: Self-pay | Admitting: Family Medicine

## 2022-05-10 DIAGNOSIS — M545 Low back pain, unspecified: Secondary | ICD-10-CM

## 2022-05-14 ENCOUNTER — Other Ambulatory Visit: Payer: Self-pay | Admitting: Family Medicine

## 2022-05-14 DIAGNOSIS — M545 Low back pain, unspecified: Secondary | ICD-10-CM

## 2022-05-19 ENCOUNTER — Other Ambulatory Visit: Payer: Self-pay | Admitting: Family Medicine

## 2022-05-19 DIAGNOSIS — M545 Low back pain, unspecified: Secondary | ICD-10-CM

## 2022-05-28 ENCOUNTER — Other Ambulatory Visit: Payer: Self-pay | Admitting: Family Medicine

## 2022-05-28 DIAGNOSIS — M545 Low back pain, unspecified: Secondary | ICD-10-CM

## 2022-08-11 ENCOUNTER — Ambulatory Visit: Payer: 59 | Admitting: Family

## 2022-08-11 ENCOUNTER — Encounter: Payer: Self-pay | Admitting: Family

## 2022-08-11 VITALS — BP 122/82 | HR 100 | Resp 18 | Ht 74.0 in | Wt 198.8 lb

## 2022-08-11 DIAGNOSIS — R040 Epistaxis: Secondary | ICD-10-CM | POA: Diagnosis not present

## 2022-08-11 DIAGNOSIS — J309 Allergic rhinitis, unspecified: Secondary | ICD-10-CM | POA: Diagnosis not present

## 2022-08-11 MED ORDER — CETIRIZINE HCL 10 MG PO TABS: 10.0000 mg | ORAL_TABLET | Freq: Every day | ORAL | 0 refills | Status: DC

## 2022-08-11 NOTE — Progress Notes (Signed)
Kenneth Berg is a 37 y.o. male with the following history as recorded in [employer]:  Patient Active Problem List   Diagnosis Date Noted   Pure hypercholesterolemia 08/05/2020   Dysfunction of eustachian tube 08/05/2020   GERD (gastroesophageal reflux disease) 08/05/2020   IBS (irritable bowel syndrome) 08/05/2020   Polyp of gallbladder 08/05/2020   Abdominal bloating 08/05/2020   Abdominal pain, left upper quadrant 08/05/2020   Abdominal pain, epigastric 08/05/2020   Acholic stool 08/05/2020   Possible exposure to STD 06/05/2016    Current Outpatient Medications  Medication Sig Dispense Refill   Ascorbic Acid (VITAMIN C PO) Take by mouth daily at 12 noon.     cetirizine (ZYRTEC) 10 MG tablet Take 1 tablet (10 mg total) by mouth daily. 30 tablet 0   dexrazoxane (ZINECARD) 250 MG injection Inject into the vein once.     magnesium citrate SOLN Take 1 Bottle by mouth once.     minoxidil (LONITEN) 2.5 MG tablet Take by mouth daily.     Multiple Vitamin (MULTIVITAMIN) tablet Take 1 tablet by mouth daily.     Multiple Vitamins-Minerals (VITAMIN D3 COMPLETE PO) Take by mouth.     Probiotic Product (PROBIOTIC-10 PO) Take by mouth.     tiZANidine (ZANAFLEX) 4 MG tablet TAKE 1 TABLET(4 MG) BY MOUTH EVERY 6 HOURS AS NEEDED FOR MUSCLE SPASMS 30 tablet 0   No current facility-administered medications for this visit.    Allergies: Nsaids, Prednisone, and Corticosteroids  Past Medical History:  Diagnosis Date   Chronic kidney disease     Past Surgical History:  Procedure Laterality Date   AMPUTATION OF REPLICATED TOES     HERNIA REPAIR  11/2015   Manipulation of Displaced nasal Septum     Turbinectomy      Family History  Problem Relation Age of Onset   Anxiety disorder Mother    Hypertension Maternal Grandmother    Diabetes Paternal Grandmother    Anxiety disorder Sister     Social History   Tobacco Use   Smoking status: Light Smoker    Packs/day: .25    Types: Cigarettes    Smokeless tobacco: Never  Substance Use Topics   Alcohol use: Yes    Alcohol/week: 10.0 standard drinks of alcohol    Types: 10 Shots of liquor per week    Subjective:   Patient felt congested for the past 2-3 days; noticed that he had some nose bleeds yesterday; wanted to be sure that nose bleed was not indicative of a problem.     Objective:  Vitals:   08/11/22 1122  BP: 122/82  Pulse: 100  Resp: 18  SpO2: 97%  Weight: 198 lb 12.8 oz (90.2 kg)  Height: 6\' 2"  (1.88 m)    General: Well developed, well nourished, in no acute distress  Skin : Warm and dry.  Head: Normocephalic and atraumatic  Eyes: Sclera and conjunctiva clear; pupils round and reactive to light; extraocular movements intact  Ears: External normal; canals clear; tympanic membranes normal  Oropharynx: Pink, supple. No suspicious lesions  Neck: Supple without thyromegaly, adenopathy  Lungs: Respirations unlabored; clear to auscultation bilaterally without wheeze, rales, rhonchi  CVS exam: normal rate and regular rhythm.  Neurologic: Alert and oriented; speech intact; face symmetrical; moves all extremities well; CNII-XII intact without focal deficit   Assessment:  1. Allergic rhinitis, unspecified seasonality, unspecified trigger   2. Nosebleed     Plan:  Reassurance; recommend trial of Zyrtec 10 mg daily; also encouraged to  use OTC saline nasal spray; follow up worse, no better.    No follow-ups on file.  No orders of the defined types were placed in this encounter.   Requested Prescriptions   Signed Prescriptions Disp Refills   cetirizine (ZYRTEC) 10 MG tablet 30 tablet 0    Sig: Take 1 tablet (10 mg total) by mouth daily.

## 2022-12-05 NOTE — Progress Notes (Unsigned)
      Established patient visit   Patient: Kenneth Berg   DOB: 1985-08-15   36 y.o. Male  MRN: [id number] Visit Date: 12/06/2022  Today's healthcare provider: Alfredia Ferguson, PA-C   No chief complaint on file.  Subjective    HPI  ***  Medications: Outpatient Medications Prior to Visit  Medication Sig   Ascorbic Acid (VITAMIN C PO) Take by mouth daily at 12 noon.   cetirizine (ZYRTEC) 10 MG tablet Take 1 tablet (10 mg total) by mouth daily.   dexrazoxane (ZINECARD) 250 MG injection Inject into the vein once.   magnesium citrate SOLN Take 1 Bottle by mouth once.   minoxidil (LONITEN) 2.5 MG tablet Take by mouth daily.   Multiple Vitamin (MULTIVITAMIN) tablet Take 1 tablet by mouth daily.   Multiple Vitamins-Minerals (VITAMIN D3 COMPLETE PO) Take by mouth.   Probiotic Product (PROBIOTIC-10 PO) Take by mouth.   tiZANidine (ZANAFLEX) 4 MG tablet TAKE 1 TABLET(4 MG) BY MOUTH EVERY 6 HOURS AS NEEDED FOR MUSCLE SPASMS   No facility-administered medications prior to visit.    Review of Systems {Insert previous labs (optional):23779} {See past labs  Heme  Chem  Endocrine  Serology  Results Review (optional):1}   Objective    There were no vitals taken for this visit. {Insert last BP/Wt (optional):23777}{See vitals history (optional):1}  Physical Exam  ***  No results found for any visits on 12/06/22.  Assessment & Plan     ***  No follow-ups on file.      {provider attestation***:1}   Alfredia Ferguson, PA-C  [facility] [place] [phone] (phone) [phone] (fax)  [facility]

## 2022-12-06 ENCOUNTER — Encounter: Payer: Self-pay | Admitting: Physician Assistant

## 2022-12-06 ENCOUNTER — Ambulatory Visit: Payer: 59 | Admitting: Physician Assistant

## 2022-12-06 ENCOUNTER — Telehealth: Payer: Self-pay | Admitting: Family Medicine

## 2022-12-06 VITALS — BP 124/76 | HR 93 | Temp 97.7°F | Ht 74.0 in | Wt 199.0 lb

## 2022-12-06 DIAGNOSIS — M25522 Pain in left elbow: Secondary | ICD-10-CM | POA: Diagnosis not present

## 2022-12-06 DIAGNOSIS — M25561 Pain in right knee: Secondary | ICD-10-CM | POA: Diagnosis not present

## 2022-12-06 NOTE — Telephone Encounter (Signed)
[facility] is needing all of pt's demographics. They only received his ins.

## 2022-12-14 ENCOUNTER — Ambulatory Visit: Payer: 59 | Admitting: Orthopaedic Surgery

## 2022-12-15 ENCOUNTER — Ambulatory Visit (INDEPENDENT_AMBULATORY_CARE_PROVIDER_SITE_OTHER): Payer: 59 | Admitting: Family Medicine

## 2022-12-15 ENCOUNTER — Encounter: Payer: Self-pay | Admitting: Family Medicine

## 2022-12-15 VITALS — BP 106/79 | HR 84 | Temp 98.0°F | Ht 74.0 in | Wt 200.0 lb

## 2022-12-15 DIAGNOSIS — Z0000 Encounter for general adult medical examination without abnormal findings: Secondary | ICD-10-CM | POA: Diagnosis not present

## 2022-12-15 NOTE — Progress Notes (Addendum)
Chief Complaint  Patient presents with   Annual Exam    Well Male Kenneth Berg is here for a complete physical.   His last physical was >1 year ago.  Current diet: in general, a "healthy" diet.   Current exercise: walking, lifting wts Weight trend: stable Fatigue out of ordinary? No. Seat belt? Yes.   Advanced directive? No  Health maintenance Tetanus- Yes HIV- Yes Hep C- Yes  Past Medical History:  Diagnosis Date   Chronic kidney disease      Past Surgical History:  Procedure Laterality Date   AMPUTATION OF REPLICATED TOES     HERNIA REPAIR  11/2015   Manipulation of Displaced nasal Septum     Turbinectomy      Medications  Current Outpatient Medications on File Prior to Visit  Medication Sig Dispense Refill   anastrozole (ARIMIDEX) 1 MG tablet Take 1 mg by mouth once a week.     Ascorbic Acid (VITAMIN C PO) Take by mouth daily at 12 noon.     minoxidil (LONITEN) 2.5 MG tablet Take by mouth daily.     Multiple Vitamin (MULTIVITAMIN) tablet Take 1 tablet by mouth daily.     Multiple Vitamins-Minerals (VITAMIN D3 COMPLETE PO) Take by mouth.     nortriptyline (PAMELOR) 10 MG capsule Take 10 mg by mouth 2 (two) times daily.     Probiotic Product (PROBIOTIC-10 PO) Take by mouth.     spironolactone (ALDACTONE) 25 MG tablet Take 25 mg by mouth at bedtime.      Allergies Allergies  Allergen Reactions   Nsaids     Other reaction(s): Other Causes hematuria?   Prednisone     Dizziness   Corticosteroids Rash    Family History Family History  Problem Relation Age of Onset   Anxiety disorder Mother    Hypertension Maternal Grandmother    Diabetes Paternal Grandmother    Anxiety disorder Sister     Review of Systems: Constitutional: no fevers or chills Eye:  no recent significant change in vision Ear/Nose/Mouth/Throat:  Ears:  no hearing loss Nose/Mouth/Throat:  no complaints of nasal congestion, no sore throat Cardiovascular:  no chest pain Respiratory:   no shortness of breath Gastrointestinal:  no abdominal pain, no change in bowel habits GU:  Male: negative for dysuria Musculoskeletal/Extremities:  no pain of the joints Integumentary (Skin/Breast):  no abnormal skin lesions reported Neurologic:  no headaches Endocrine: No unexpected weight changes Hematologic/Lymphatic:  no night sweats  Exam BP 106/79 (BP Location: Left Arm, Patient Position: Sitting, Cuff Size: Large)   Pulse 84   Temp 98 F (36.7 C) (Oral)   Ht 6\' 2"  (1.88 m)   Wt 200 lb (90.7 kg)   SpO2 97%   BMI 25.68 kg/m  General:  well developed, well nourished, in no apparent distress Skin:  no significant moles, warts, or growths Head:  no masses, lesions, or tenderness Eyes:  pupils equal and round, sclera anicteric without injection Ears:  canals without lesions, TMs shiny without retraction, no obvious effusion, no erythema Nose:  nares patent, mucosa normal Throat/Pharynx:  lips and gingiva without lesion; tongue and uvula midline; non-inflamed pharynx; no exudates or postnasal drainage Neck: neck supple without adenopathy, thyromegaly, or masses Lungs:  clear to auscultation, breath sounds equal bilaterally, no respiratory distress Cardio:  regular rate and rhythm, no bruits, no LE edema Abdomen:  abdomen soft, nontender; bowel sounds normal; no masses or organomegaly Genital (male): Deferred Rectal: Deferred Musculoskeletal: Very tight hamstrings bilaterally, worse  on the right; mild TTP over the lateral left triceps; symmetrical muscle groups noted without atrophy or deformity Extremities:  no clubbing, cyanosis, or edema, no deformities, no skin discoloration Neuro:  gait normal; deep tendon reflexes normal and symmetric Psych: well oriented with normal range of affect and appropriate judgment/insight  Assessment and Plan  Well adult exam - Plan: Lipid panel  Well 37 y.o. male. Counseled on diet and exercise. Self testicular exams recommended at least  monthly.  Advanced directive form provided today.  Other orders as above. Stretches and exercises for the tricep and hamstrings provided. Follow up in 1 year pending the above workup. The patient voiced understanding and agreement to the plan.  Jilda Roche [place], DO 12/15/22 11:58 AM

## 2022-12-15 NOTE — Patient Instructions (Addendum)
Give [place] 2-3 business days to get the results of your labs back.   Keep the diet clean and stay active.  Please get me a copy of your advanced directive form at your convenience.   I recommend getting the flu shot in mid October. This suggestion would change if the CDC comes out with a different recommendation.   Do monthly self testicular checks in the shower. You are feeling for lumps/bumps that don't belong. If you feel anything like this, let me know!  Let us know if you need anything.  Semimembranosus Tendinitis Rehab  It is normal to feel mild stretching, pulling, tightness, or discomfort as you do these exercises, but you should stop right away if you feel sudden pain or your pain gets worse.  Stretching and range of motion exercises These exercises warm up your muscles and joints and improve the movement and flexibility of your thigh. These exercises also help to relieve pain, numbness, and tingling. Exercise A: Hamstring stretch, supine    Lie on your back. Loop a belt or towel across the ball of your left / right foot The ball of your foot is on the walking surface, right under your toes. Straighten your left / right knee and slowly pull on the belt to raise your leg. Stop when you feel a gentle stretch behind your left / right knee or thigh. Do not allow the knee to bend. Keep your other leg flat on the floor. Hold this position for 30 seconds. Repeat 2 times. Complete this exercise 3 times a week. Strengthening exercises These exercises build strength and endurance in your thigh. Endurance is the ability to use your muscles for a long time, even after they get tired. Exercise B: Straight leg raises (hip extensors) Lie on your belly on a bed or a firm surface with a pillow under your hips. Bend your left / right knee so your foot is straight up in the air. Squeeze your buttock muscles and lift your left / right thigh off the bed. Do not let your back arch. Hold this position  for 3 seconds. Slowly return to the starting position. Let your muscles relax completely before you do another repetition. Repeat 2 times. Complete this exercise 3 times a week. Exercise C: Bridge (hip extensors)     Lie on your back on a firm surface with your knees bent and your feet flat on the floor. Tighten your buttocks muscles and lift your bottom off the floor until your trunk is level with your thighs. You should feel the muscles working in your buttocks and the back of your thighs. If you do not feel these muscles, slide your feet 1-2 inches (2.5-5 cm) farther away from your buttocks. Do not arch your back. Hold this position for 3 seconds. Slowly lower your hips to the starting position. Let your buttocks muscles relax completely between repetitions. If this exercise is too easy, try doing it with your arms crossed over your chest. Repeat 2 times. Complete this exercise 3 times a week. Exercise D: Hamstring eccentric, prone Lie on your belly on a bed or on the floor. Start with your legs straight. Cross your legs at the ankles with your left / right leg on top. Using your bottom leg to do the work, bend both knees. Using just your left / right leg alone, slowly lower your leg back down toward the bed. Add a 5 lb weight as told by your health care provider. Let your muscles  relax completely between repetitions. Repeat 2 times. Complete this exercise 3 times a week. Exercise E: Squats Stand in front of a table, with your feet and knees pointing straight ahead. You may rest your hands on the table for balance but not for support. Slowly bend your knees and lower your hips like you are going to sit in a chair. Keep your thighs straight or pointed slightly outward. Keep your weight over your heels, not over your toes. Keep your lower legs upright so they are parallel with the table legs. Do not let your hips go lower than your knees. Stop when your knees are bent to the shape of an  upside-down letter "L" (90 degree angle). Do not bend lower than told by your health care provider. If your knee pain increases, do not bend as low. Hold the squat position 1-2 seconds. Slowly push with your legs to return to standing. Do not use your hands to pull yourself to standing. Repeat 2 times. Complete this exercise 3 times a week. Make sure you discuss any questions you have with your health care provider. Document Released: 03/27/2005 Document Revised: 12/02/2015 Document Reviewed: 12/29/2014 Elsevier Interactive Patient Education  2018 [employer].  Triceps Rehab Ask your health care provider which exercises are safe for you. Do exercises exactly as told by your health care provider and adjust them as directed. It is normal to feel mild stretching, pulling, tightness, or discomfort as you do these exercises, but you should stop right away if you feel sudden pain or your pain gets worse. Do not begin these exercises until told by your health care provider. Stretching and range of motion exercises These exercises warm up your muscles and joints and improve the movement and flexibility of your arm. These exercises can also help to relieve pain, numbness, and tingling. Exercise A: Elbow flexion, passive  Stand or sit with your left / right arm at your side. Use your other hand to gently push your left / right hand toward your shoulder. Bend your elbow as far as your health care provider tells you to. You may be instructed to try to bend your elbow more and more over time. Hold for 30 seconds. Slowly return to the starting position. Repeat 2 times. Complete this exercise 3 times a week. Exercise B: Supination, passive  Sit with your left / right elbow bent to an "L" shape (90 degrees) and your forearm resting on a table, palm-down. Keeping your upper body and shoulder still, use your other hand to rotate your left / right palm up. Stop when you feel a gentle to moderate  stretch. Hold for 30 seconds. Slowly return to the starting position. Repeat 2 times. Complete this exercise 3 times a week. Exercise C: Pronation, passive Sit with your left / right elbow bent to an "L" shape (90 degrees) and your forearm resting on a table, palm-up. Keeping your upper body and shoulder still, use your other hand to rotate your left / right palm down. Stop when you feel a gentle to moderate stretch. Hold for 30 seconds. Slowly return to the starting position. Repeat 2 times. Complete this exercise 3 times a week. Exercise D: Elbow extension, passive, supine Lie on your back in a comfortable position that allows you to relax your arm muscles. Place a folded towel under your left / right upper arm so your elbow and shoulder are at the same height. Hold your left / right arm out straight with your other hand  supporting it. Use your other arm to raise your left / right arm until your elbow does not rest on the bed or towel. Let the weight of your hand stretch the inside of your left / right elbow. Keep your arm and chest muscles relaxed. If directed, you may hold a 5 lb weight in your hand to increase the intensity of the stretch. Hold for 30 seconds. Slowly release the stretch. Repeat 2 times. Complete this exercise 3 times a week. Exercise E: Elbow flexion, active Start this exercise only when your healthcare provider tells you. Stand or sit with your left / right elbow bent and your palm facing in, toward your body. Bend your elbow as far as you can using only your arm muscles. Hold for 30 seconds. Slowly return to the starting position. Repeat 2 times. Complete this exercise 3 times a week. Exercise F: Supination, active Start this exercise only when your healthcare provider tells you. Stand or sit with your left / right elbow bent to an "L" shape (90 degrees). Rotate your palm up until you feel a gentle stretch on the inside of your forearm. Hold for 30  seconds. Slowly return to the starting position. Repeat 2 times. Complete this exercise 3 times a week. Exercise G: Pronation, active  Start this exercise only when your healthcare provider tells you. Stand or sit with your left / right elbow bent to an "L" shape (90 degrees). Rotate your palm down until you feel a gentle stretch on the top of your forearm. Hold for 30 seconds. Slowly release and return to the starting position. Repeat 2 times. Complete this exercise 3 times a week. Exercise H: Elbow extension, active Start this exercise only when your healthcare provider tells you. Stand or sit with your left / right elbow bent and your palm facing in, toward your body. Slowly straighten your elbow using only your arm muscles. Stop when you feel a very slight stretch at the front of your arm, or when you reach the position where your health care provider tells you to stop. You may be instructed to try to straighten your arm more and more each week. Hold for 30 seconds. Slowly return to the starting position. Repeat 2 times. Complete this exercise 3 times a week. Strengthening exercises These exercises build strength and endurance in your arm and shoulder. Endurance is the ability to use your muscles for a long time, even after your muscles get tired. Do not start any strengthening exercises until told by your health care provider. Exercise I: Elbow flexion, isometric  Stand or sit with your left / right arm at waist height. Your palm should face in, toward your body. Place your other hand on top of your left / right forearm. Gently push down while you resist with your left / right arm. Use about half (50%) effort with both arms. You may be instructed to use more and more effort with your arms each week. Try not to let your right / left elbow move. Hold for 3 seconds. Let your muscles relax completely before you repeat this exercise. Repeat 2 times. Complete this exercise 3 times a  week. Exercise J: Elbow extension, isometric  Stand or sit with your left / right arm at waist height. Your palm should face in, toward your body. Place your other hand on the bottom of your left / right forearm. Gently push up while you resist with your left / right arm. Use about half (50%) effort with  both arms. You may be instructed to use more and more effort with your arms each week. Try not to let your left / right elbow move. Hold for 3 seconds. Let your muscles relax completely before you repeat this exercise. Repeat 2 times. Complete this exercise 3 times a week. Exercise K: Biceps curls (elbow flexion, supinated) Sit on a stable chair without armrests, or stand. Hold a 5 lbweight in your left / right hand. Your palm should face out, away from your body, at the starting position. Bend your left / right elbow and move your hand up toward your shoulder. Slowly return to the starting position. Repeat 2 times. Complete this exercise 3 times a week. Exercise L: Hammer curls (elbow flexion, neutral forearm) Sit on a stable chair without armrests, or stand. Hold a 5 weight in your left / right hand, or hold an exercise band with both hands. Your palms should face each other at the starting position. Bend your left / right elbow and move your hand up toward your shoulder. Keep your other arm straight. Slowly return to the starting position. Repeat 2 times. Complete this exercise 3 times a week. Exercise M: Triceps curls ( elbow extension) Lie on your back. Hold a 5 lb weight in your left / right hand. Bend your left / right elbow to an "L" shape (90 degrees) so the weight is in front of your face, over your chest, and your elbow is pointed up to the ceiling. Straighten your elbow, raising your hand toward the ceiling. Use your other hand to support your left / right upper arm. Slowly return to the starting position. Repeat 2 times. Complete this exercise 3 times a week. Exercise N:  Elbow extension with exercise band  Sit on a stable chair without armrests, or stand. Hold an exercise band in both hands. Keeping your upper arms at your side, bring both hands up to your shoulder. Keep your left / right hand just below your other hand. Straighten your left / right elbow while keeping your other arm still. Hold for 3 seconds. Slowly bend your elbow to return to the starting position. Repeat 2 times. Complete this exercise 3 times a week. Exercise O: Supination  Sit with your left / right forearm supported on a table. Your elbow should be at waist height. Rest your hand over the edge of the table, palm-down. Gently grasp a lightweight hammer. Without moving your left / right elbow, slowly rotate your palm up, to a "thumbs-up" position. Hold for3 seconds. Slowly return to the starting position. Repeat 2 times. Complete this exercise 3 times a week. Exercise P: Pronation  Sit with your left / right forearm supported on a table. Your elbow should be at waist height. Rest your hand over the edge of the table, palm-up. Gently grasp a lightweight hammer. Without moving your left / right elbow, slowly rotate your palm down. Hold for 3 seconds. Slowly return to the starting position. Repeat 2 times. Complete this exercise 3 times per week. This information is not intended to replace advice given to you by your health care provider. Make sure you discuss any questions you have with your health care provider. Document Released: 03/27/2005 Document Revised: 12/02/2015 Document Reviewed: 12/20/2014 Elsevier Interactive Patient Education  [employer].

## 2022-12-26 ENCOUNTER — Ambulatory Visit: Payer: 59 | Admitting: Orthopaedic Surgery

## 2023-01-04 ENCOUNTER — Ambulatory Visit: Payer: 59 | Admitting: Orthopaedic Surgery

## 2023-01-04 ENCOUNTER — Encounter: Payer: Self-pay | Admitting: Orthopaedic Surgery

## 2023-01-04 ENCOUNTER — Other Ambulatory Visit (INDEPENDENT_AMBULATORY_CARE_PROVIDER_SITE_OTHER): Payer: 59

## 2023-01-04 DIAGNOSIS — M79645 Pain in left finger(s): Secondary | ICD-10-CM | POA: Diagnosis not present

## 2023-01-04 NOTE — Progress Notes (Signed)
Office Visit Note   Patient: Kenneth Berg           Date of Birth: 1985-09-27           MRN: [id number] Visit Date: 01/04/2023              Requested by: Kenneth Ferguson, PA-C [address] [place],  [place] [address] PCP: Kenneth Dory, DO   Assessment & Plan: Visit Diagnoses:  1. Pain of finger of left hand     Plan: Kenneth Berg is a 37 year old gentleman with a mild to moderate strain of the left triceps as well as a questionable left small finger middle phalanx fracture.  Overall the symptoms are manageable.  I recommend a longer period of rest for the triceps strain for up to 6 to 8 weeks depending on how he feels.  For the finger I recommended compression sleeve and use of the finger and range of motion as tolerated.  Recommend buddy taping to the ring finger.  He is doing well overall and I do not think he needs to follow-up with [place].  Follow-Up Instructions: No follow-ups on file.   Orders:  Orders Placed This Encounter  Procedures   XR Finger Little Left   No orders of the defined types were placed in this encounter.     Procedures: No procedures performed   Clinical Data: No additional findings.   Subjective: Chief Complaint  Patient presents with   Left Elbow - Pain   Left Hand - Pain    Pinky finger    HPI Kenneth Berg is a 37 year old gentleman comes in for evaluation of 2 separate complaints.  The first problem is that he strained his elbow while doing a triceps pulldown about 2 months ago.  He felt pain along the posterior lateral aspect of the upper arm.  Denies any numbness and tingling.  He rested this for about a week and a half but then the pain returned when he back to lifting.  The other problem is that he fell on his left small finger while playing kickball 3 weeks ago.  He had immediate pain and swelling which have improved.   Review of Systems  Constitutional: Negative.   HENT: Negative.    Eyes: Negative.   Respiratory:  Negative.    Cardiovascular: Negative.   Gastrointestinal: Negative.   Endocrine: Negative.   Genitourinary: Negative.   Skin: Negative.   Allergic/Immunologic: Negative.   Neurological: Negative.   Hematological: Negative.   Psychiatric/Behavioral: Negative.    All other systems reviewed and are negative.    Objective: Vital Signs: There were no vitals taken for this visit.  Physical Exam Vitals and nursing note reviewed.  Constitutional:      Appearance: He is well-developed.  HENT:     Head: Normocephalic and atraumatic.  Eyes:     Pupils: Pupils are equal, round, and reactive to light.  Pulmonary:     Effort: Pulmonary effort is normal.  Abdominal:     Palpations: Abdomen is soft.  Musculoskeletal:        General: Normal range of motion.     Cervical back: Neck supple.  Skin:    General: Skin is warm.  Neurological:     Mental Status: He is alert and oriented to person, place, and time.  Psychiatric:        Behavior: Behavior normal.        Thought Content: Thought content normal.  Judgment: Judgment normal.     Ortho Exam Exam of the left elbow shows tenderness along the posterior lateral aspect of the triceps.  He has normal triceps function.  Olecranon is nontender.  Lateral epicondyle is nontender.  Full range of motion.  Exam of the left small finger shows swelling to the PIP joint.  Slight restriction to flexion of the PIP joint secondary to swelling.  Collateral ligaments are stable.  Flexor extensor tendon functions are intact. Specialty Comments:  No specialty comments available.  Imaging: XR Finger Little Left  Result Date: 01/04/2023 X-rays of the left small finger shows questionable nondisplaced fracture of the volar base of the middle phalanx.  The joint is congruent.    PMFS History: Patient Active Problem List   Diagnosis Date Noted   Pure hypercholesterolemia 08/05/2020   Dysfunction of eustachian tube 08/05/2020   GERD  (gastroesophageal reflux disease) 08/05/2020   IBS (irritable bowel syndrome) 08/05/2020   Polyp of gallbladder 08/05/2020   Abdominal bloating 08/05/2020   Abdominal pain, left upper quadrant 08/05/2020   Abdominal pain, epigastric 08/05/2020   Acholic stool 08/05/2020   Possible exposure to STD 06/05/2016   Past Medical History:  Diagnosis Date   Chronic kidney disease     Family History  Problem Relation Age of Onset   Anxiety disorder Mother    Hypertension Maternal Grandmother    Diabetes Paternal Grandmother    Anxiety disorder Sister     Past Surgical History:  Procedure Laterality Date   AMPUTATION OF REPLICATED TOES     HERNIA REPAIR  11/2015   Manipulation of Displaced nasal Septum     Turbinectomy     Social History   Occupational History   Not on file  Tobacco Use   Smoking status: Light Smoker    Current packs/day: 0.25    Types: Cigarettes   Smokeless tobacco: Never  Substance and Sexual Activity   Alcohol use: Yes    Alcohol/week: 10.0 standard drinks of alcohol    Types: 10 Shots of liquor per week   Drug use: No   Sexual activity: Not on file
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
## 2023-10-24 ENCOUNTER — Encounter: Payer: Self-pay | Admitting: Family Medicine

## 2023-10-24 ENCOUNTER — Ambulatory Visit (INDEPENDENT_AMBULATORY_CARE_PROVIDER_SITE_OTHER): Admitting: Family Medicine

## 2023-10-24 VITALS — BP 124/80 | HR 100 | Temp 98.0°F | Resp 16 | Ht 74.0 in | Wt 207.0 lb

## 2023-10-24 DIAGNOSIS — M79674 Pain in right toe(s): Secondary | ICD-10-CM | POA: Diagnosis not present

## 2023-10-24 NOTE — Progress Notes (Signed)
 Chief Complaint  Patient presents with   Toe Injury    Toe Injury    Kenneth Berg is a 38 y.o. male here for a skin complaint.  Duration: 10 days Location: Right great toe Pruritic?  No Painful? Yes Drainage? No New soaps/lotions/topicals/detergents? No Trauma?  He was kicking a dog toy which is pretty hard Other associated symptoms: Yellowish lesion on nail Therapies tried thus far: None  Past Medical History:  Diagnosis Date   Chronic kidney disease     BP 124/80 (BP Location: Left Arm, Patient Position: Sitting)   Pulse 100   Temp 98 F (36.7 C) (Oral)   Resp 16   Ht 6' 2 (1.88 m)   Wt 207 lb (93.9 kg)   SpO2 98%   BMI 26.58 kg/m  Gen: awake, alert, appearing stated age Lungs: No accessory muscle use Skin: mild ttp over 1st digit on R foot; yellowish streak along nail (looks like separation). No drainage, erythema,  fluctuance, excoriation Psych: Age appropriate judgment and insight  Pain of toe of right foot  It looks like onycholysis.  Reassurance for now.  If anything changes he will let me know.  Tylenol for pain. F/u prn. The patient voiced understanding and agreement to the plan.  Mabel [place], DO 10/24/23 12:26 PM

## 2023-10-24 NOTE — Patient Instructions (Addendum)
 The areas on your shoulders are reassuring and should resolve on its own.   The area on your toe should grow out. This is not indicative of cancer.   OK to take Tylenol 1000 mg (2 extra strength tabs) or 975 mg (3 regular strength tabs) every 6 hours as needed.  To prepare a bleach bath, one-fourth to one-half cup of bleach is placed in a full bathtub (about 40 gallons) of water. Bleach baths are usually taken for 5 to 10 minutes twice per week and should be followed by application of an emollient.  Let us  know if you need anything.

## 2023-12-18 ENCOUNTER — Ambulatory Visit (INDEPENDENT_AMBULATORY_CARE_PROVIDER_SITE_OTHER): Admitting: Family Medicine

## 2023-12-18 ENCOUNTER — Ambulatory Visit: Payer: Self-pay | Admitting: Family Medicine

## 2023-12-18 ENCOUNTER — Encounter: Payer: Self-pay | Admitting: Family Medicine

## 2023-12-18 VITALS — BP 118/76 | HR 88 | Temp 97.7°F | Resp 16 | Ht 74.0 in | Wt 204.4 lb

## 2023-12-18 DIAGNOSIS — Z13220 Encounter for screening for lipoid disorders: Secondary | ICD-10-CM

## 2023-12-18 DIAGNOSIS — Z0000 Encounter for general adult medical examination without abnormal findings: Secondary | ICD-10-CM | POA: Diagnosis not present

## 2023-12-18 LAB — CBC
HCT: 48.8 % (ref 39.0–52.0)
Hemoglobin: 16.3 g/dL (ref 13.0–17.0)
MCHC: 33.5 g/dL (ref 30.0–36.0)
MCV: 86.4 fl (ref 78.0–100.0)
Platelets: 246 K/uL (ref 150.0–400.0)
RBC: 5.65 Mil/uL (ref 4.22–5.81)
RDW: 13.4 % (ref 11.5–15.5)
WBC: 5.2 K/uL (ref 4.0–10.5)

## 2023-12-18 LAB — LIPID PANEL
Cholesterol: 167 mg/dL (ref 0–200)
HDL: 30.5 mg/dL — ABNORMAL LOW (ref >39.00)
LDL Cholesterol: 116 mg/dL — ABNORMAL HIGH (ref 0–99)
NonHDL: 136.2
Total CHOL/HDL Ratio: 5
Triglycerides: 103 mg/dL (ref 0.0–149.0)
VLDL: 20.6 mg/dL (ref 0.0–40.0)

## 2023-12-18 LAB — COMPREHENSIVE METABOLIC PANEL WITH GFR
ALT: 31 U/L (ref 0–53)
AST: 17 U/L (ref 0–37)
Albumin: 4.6 g/dL (ref 3.5–5.2)
Alkaline Phosphatase: 45 U/L (ref 39–117)
BUN: 19 mg/dL (ref 6–23)
CO2: 33 meq/L — ABNORMAL HIGH (ref 19–32)
Calcium: 9.5 mg/dL (ref 8.4–10.5)
Chloride: 100 meq/L (ref 96–112)
Creatinine, Ser: 1.08 mg/dL (ref 0.40–1.50)
GFR: 88.15 mL/min (ref >60.00)
Glucose, Bld: 90 mg/dL (ref 70–99)
Potassium: 4.3 meq/L (ref 3.5–5.1)
Sodium: 138 meq/L (ref 135–145)
Total Bilirubin: 0.9 mg/dL (ref 0.2–1.2)
Total Protein: 7.1 g/dL (ref 6.0–8.3)

## 2023-12-18 MED ORDER — METHYLPREDNISOLONE 4 MG PO TBPK: ORAL_TABLET | ORAL | 0 refills | Status: AC

## 2023-12-18 MED ORDER — FLUTICASONE PROPIONATE 50 MCG/ACT NA SUSP: 2.0000 | Freq: Every day | NASAL | 6 refills | Status: AC

## 2023-12-18 NOTE — Patient Instructions (Addendum)
 Give us  2-3 business days to get the results of your labs back.   Claritin (loratadine), Allegra (fexofenadine), Zyrtec  (cetirizine ) which is also equivalent to Xyzal (levocetirizine); these are listed in order from weakest to strongest. Generic, and therefore cheaper, options are in the parentheses.   Flonase  (fluticasone ); nasal spray that is over the counter. 2 sprays each nostril, once daily. Aim towards the same side eye when you spray.  There are available OTC, and the generic versions, which may be cheaper, are in parentheses. Show this to a pharmacist if you have trouble finding any of these items.  Keep the diet clean and stay active.  Consider metatarsal pads for the foot.  Send me a message in early Oct if no improvement with your foot.   Please get me a copy of your advanced directive form at your convenience.   Let us  know if you need anything.

## 2023-12-18 NOTE — Progress Notes (Signed)
 Chief Complaint  Patient presents with   Annual Exam    CPE    Well Male Kenneth Berg is here for a complete physical.   His last physical was >1 year ago.  Current diet: in general, a healthy diet.   Current exercise: lifting wts, cycling Weight trend: stable Fatigue out of ordinary? No. Seat belt? Yes.   Advanced directive? No  Health maintenance Tetanus- Yes HIV- Yes Hep C- Yes  Past Medical History:  Diagnosis Date   Chronic kidney disease      Past Surgical History:  Procedure Laterality Date   AMPUTATION OF REPLICATED TOES     HERNIA REPAIR  11/2015   Manipulation of Displaced nasal Septum     Turbinectomy      Medications  Current Outpatient Medications on File Prior to Visit  Medication Sig Dispense Refill   anastrozole (ARIMIDEX) 1 MG tablet Take 1 mg by mouth once a week.     minoxidil (LONITEN) 2.5 MG tablet Take by mouth daily.     Multiple Vitamin (MULTIVITAMIN) tablet Take 1 tablet by mouth daily.     nortriptyline (PAMELOR) 10 MG capsule Take 10 mg by mouth 2 (two) times daily.     Probiotic Product (PROBIOTIC-10 PO) Take by mouth.      Allergies Allergies  Allergen Reactions   Nsaids     Other reaction(s): Other Causes hematuria?   Prednisone     Dizziness    Family History Family History  Problem Relation Age of Onset   Anxiety disorder Mother    Hypertension Maternal Grandmother    Diabetes Paternal Grandmother    Anxiety disorder Sister     Review of Systems: Constitutional: no fevers or chills Eye:  no recent significant change in vision Ear/Nose/Mouth/Throat:  Ears:  no hearing loss Nose/Mouth/Throat:  no complaints of nasal congestion, no sore throat Cardiovascular:  no chest pain Respiratory:  no shortness of breath Gastrointestinal:  no abdominal pain, no change in bowel habits GU:  Male: negative for dysuria Musculoskeletal/Extremities:  +L foot pain Integumentary (Skin/Breast):  no abnormal skin lesions  reported Neurologic:  no headaches Endocrine: No unexpected weight changes Hematologic/Lymphatic:  no night sweats  Exam BP 118/76 (BP Location: Left Arm, Patient Position: Sitting)   Pulse 88   Temp 97.7 F (36.5 C) (Oral)   Resp 16   Ht 6' 2 (1.88 m)   Wt 204 lb 6.4 oz (92.7 kg)   SpO2 98%   BMI 26.24 kg/m  General:  well developed, well nourished, in no apparent distress Skin:  no significant moles, warts, or growths Head:  no masses, lesions, or tenderness Eyes:  pupils equal and round, sclera anicteric without injection Ears:  canals without lesions, TMs shiny without retraction, no obvious effusion, no erythema Nose:  nares patent, mucosa normal Throat/Pharynx:  lips and gingiva without lesion; tongue and uvula midline; non-inflamed pharynx; no exudates or postnasal drainage Neck: neck supple without adenopathy, thyromegaly, or masses Lungs:  clear to auscultation, breath sounds equal bilaterally, no respiratory distress Cardio:  regular rate and rhythm, no bruits, no LE edema Abdomen:  abdomen soft, nontender; bowel sounds normal; no masses or organomegaly Genital (male): Deferred Rectal: Deferred Musculoskeletal:  TTP over L MTP (trauma) Extremities:  no clubbing, cyanosis, or edema, no deformities, no skin discoloration Neuro:  gait normal; deep tendon reflexes normal and symmetric Psych: well oriented with normal range of affect and appropriate judgment/insight  Assessment and Plan  Well adult exam - Plan:  CBC, Comprehensive metabolic panel with GFR, Lipid panel   Well 38 y.o. male. Counseled on diet and exercise. Self testicular exams recommended at least monthly.  Advanced directive form provided today.  MT pads, Medrol  Dosepak, ice, Tylenol. Refer sports med if no better.  Other orders as above. Follow up in 1 year pending the above workup. The patient voiced understanding and agreement to the plan.  Mabel [place], DO 12/18/23 10:05 AM

## 2024-12-19 ENCOUNTER — Encounter: Admitting: Family Medicine
